# Patient Record
Sex: Female | Born: 1945 | Hispanic: No | Smoking: Never smoker
Health system: Southern US, Community
[De-identification: ages and names within clinical notes are randomized; demographics above are authoritative.]

## PROBLEM LIST (undated history)

## (undated) DIAGNOSIS — E119 Type 2 diabetes mellitus without complications: Secondary | ICD-10-CM

## (undated) DIAGNOSIS — I10 Essential (primary) hypertension: Secondary | ICD-10-CM

## (undated) DIAGNOSIS — C801 Malignant (primary) neoplasm, unspecified: Secondary | ICD-10-CM

## (undated) HISTORY — PX: COLON RESECTION: SHX5231

---

## 2015-06-19 ENCOUNTER — Emergency Department (HOSPITAL_COMMUNITY): Payer: Self-pay

## 2015-06-19 ENCOUNTER — Emergency Department (HOSPITAL_COMMUNITY)
Admission: EM | Admit: 2015-06-19 | Discharge: 2015-06-19 | Disposition: A | Discharge status: Home / Self Care | Payer: Self-pay | Attending: Emergency Medicine | Admitting: Emergency Medicine

## 2015-06-19 ENCOUNTER — Encounter (HOSPITAL_COMMUNITY): Payer: Self-pay | Admitting: *Deleted

## 2015-06-19 DIAGNOSIS — M5431 Sciatica, right side: Secondary | ICD-10-CM

## 2015-06-19 DIAGNOSIS — R748 Abnormal levels of other serum enzymes: Secondary | ICD-10-CM

## 2015-06-19 DIAGNOSIS — R109 Unspecified abdominal pain: Secondary | ICD-10-CM | POA: Insufficient documentation

## 2015-06-19 DIAGNOSIS — Z85038 Personal history of other malignant neoplasm of large intestine: Secondary | ICD-10-CM | POA: Insufficient documentation

## 2015-06-19 DIAGNOSIS — M25551 Pain in right hip: Secondary | ICD-10-CM | POA: Insufficient documentation

## 2015-06-19 DIAGNOSIS — I10 Essential (primary) hypertension: Secondary | ICD-10-CM | POA: Insufficient documentation

## 2015-06-19 DIAGNOSIS — E119 Type 2 diabetes mellitus without complications: Secondary | ICD-10-CM | POA: Insufficient documentation

## 2015-06-19 HISTORY — DX: Type 2 diabetes mellitus without complications: E11.9

## 2015-06-19 HISTORY — DX: Essential (primary) hypertension: I10

## 2015-06-19 HISTORY — DX: Malignant (primary) neoplasm, unspecified: C80.1

## 2015-06-19 LAB — COMPREHENSIVE METABOLIC PANEL
ALK PHOS: 53 U/L (ref 38–126)
ALT: 15 U/L (ref 14–54)
AST: 18 U/L (ref 15–41)
Albumin: 3.6 g/dL (ref 3.5–5.0)
Anion gap: 9 (ref 5–15)
BILIRUBIN TOTAL: 1 mg/dL (ref 0.3–1.2)
BUN: 16 mg/dL (ref 6–20)
CALCIUM: 8.7 mg/dL — AB (ref 8.9–10.3)
CO2: 23 mmol/L (ref 22–32)
CREATININE: 0.94 mg/dL (ref 0.44–1.00)
Chloride: 106 mmol/L (ref 101–111)
GFR, EST NON AFRICAN AMERICAN: 59 mL/min — AB (ref >60)
Glucose, Bld: 194 mg/dL — ABNORMAL HIGH (ref 65–99)
Potassium: 4.6 mmol/L (ref 3.5–5.1)
Sodium: 138 mmol/L (ref 135–145)
Total Protein: 7.4 g/dL (ref 6.5–8.1)

## 2015-06-19 LAB — LIPASE, BLOOD: Lipase: 72 U/L — ABNORMAL HIGH (ref 11–51)

## 2015-06-19 LAB — URINALYSIS, ROUTINE W REFLEX MICROSCOPIC
BILIRUBIN URINE: NEGATIVE
Glucose, UA: NEGATIVE mg/dL
HGB URINE DIPSTICK: NEGATIVE
Ketones, ur: NEGATIVE mg/dL
Nitrite: NEGATIVE
PROTEIN: NEGATIVE mg/dL
Specific Gravity, Urine: 1.021 (ref 1.005–1.030)
pH: 5 (ref 5.0–8.0)

## 2015-06-19 LAB — CBC
HCT: 35.1 % — ABNORMAL LOW (ref 36.0–46.0)
Hemoglobin: 11.1 g/dL — ABNORMAL LOW (ref 12.0–15.0)
MCH: 28.2 pg (ref 26.0–34.0)
MCHC: 31.6 g/dL (ref 30.0–36.0)
MCV: 89.3 fL (ref 78.0–100.0)
PLATELETS: 230 10*3/uL (ref 150–400)
RBC: 3.93 MIL/uL (ref 3.87–5.11)
RDW: 14.4 % (ref 11.5–15.5)
WBC: 7.2 10*3/uL (ref 4.0–10.5)

## 2015-06-19 LAB — URINE MICROSCOPIC-ADD ON

## 2015-06-19 MED ORDER — TRAMADOL HCL 50 MG PO TABS: 50.0000 mg | ORAL_TABLET | Freq: Four times a day (QID) | ORAL | Status: AC | PRN

## 2015-06-19 MED ORDER — IOPAMIDOL (ISOVUE-300) INJECTION 61%
INTRAVENOUS | Status: AC
  Administered 2015-06-19: 100 mL
  Filled 2015-06-19: qty 100

## 2015-06-19 NOTE — ED Notes (Signed)
Pt c/o R hip and R lower back pain x 5 days that increases when she walks.  States LLQ pain also started about the same time.  Hx of colon ca 2014 with hernia repair 2016.  Denies changes in bowel habits.

## 2015-06-19 NOTE — ED Provider Notes (Signed)
CSN: [id number]     Arrival date & time 06/19/15  [id number] History   First MD Initiated Contact with Patient 06/19/15 1122     Chief Complaint  Patient presents with  . Back Pain  . Abdominal Pain     (Consider location/radiation/quality/duration/timing/severity/associated sxs/prior Treatment) Patient is a 70 y.o. female presenting with back pain and abdominal pain. The history is provided by the patient (Patient complains of mild abdominal discomfort and pain in the right hip).  Back Pain Location:  Lumbar spine Quality:  Aching Radiates to: Pain in right hip. Pain severity:  Moderate Pain is:  Same all the time Onset quality:  Sudden Timing:  Constant Progression:  Waxing and waning Chronicity:  New Context: not emotional stress   Associated symptoms: abdominal pain   Associated symptoms: no chest pain and no headaches   Abdominal Pain Associated symptoms: no chest pain, no cough, no diarrhea, no fatigue and no hematuria     Past Medical History  Diagnosis Date  . Cancer ([place])     colon  . Diabetes mellitus without complication ([place])   . Hypertension    Past Surgical History  Procedure Laterality Date  . Colon resection     No family history on file. Social History  Substance Use Topics  . Smoking status: Never Smoker   . Smokeless tobacco: None  . Alcohol Use: No   OB History    No data available     Review of Systems  Constitutional: Negative for appetite change and fatigue.  HENT: Negative for congestion, ear discharge and sinus pressure.   Eyes: Negative for discharge.  Respiratory: Negative for cough.   Cardiovascular: Negative for chest pain.  Gastrointestinal: Positive for abdominal pain. Negative for diarrhea.  Genitourinary: Negative for frequency and hematuria.  Musculoskeletal: Positive for back pain.       Right hip pain  Skin: Negative for rash.  Neurological: Negative for seizures and headaches.  Psychiatric/Behavioral: Negative for  hallucinations.      Allergies  Review of patient's allergies indicates no known allergies.  Home Medications   Prior to Admission medications   Medication Sig Start Date End Date Taking? Authorizing Provider  traMADol (ULTRAM) 50 MG tablet Take 1 tablet (50 mg total) by mouth every 6 (six) hours as needed. 06/19/15   Milton Ferguson, MD   BP 139/79 mmHg  Pulse 66  Temp(Src) 98.6 F (37 C) (Oral)  Resp 16  Ht 6\' 5"  (1.956 m)  Wt 154 lb 5.2 oz (70 kg)  BMI 18.30 kg/m2  SpO2 98% Physical Exam  Constitutional: She is oriented to person, place, and time. She appears well-developed.  HENT:  Head: Normocephalic.  Eyes: Conjunctivae and EOM are normal. No scleral icterus.  Neck: Neck supple. No thyromegaly present.  Cardiovascular: Normal rate and regular rhythm.  Exam reveals no gallop and no friction rub.   No murmur heard. Pulmonary/Chest: No stridor. She has no wheezes. She has no rales. She exhibits no tenderness.  Abdominal: She exhibits no distension. There is tenderness. There is no rebound.  Minimal left lower quadrant tenderness  Musculoskeletal: She exhibits no edema.  Mild tenderness to right hip  Lymphadenopathy:    She has no cervical adenopathy.  Neurological: She is oriented to person, place, and time. She exhibits normal muscle tone. Coordination normal.  Skin: No rash noted. No erythema.  Psychiatric: She has a normal mood and affect. Her behavior is normal.    ED Course  Procedures (including critical  care time) Labs Review Labs Reviewed  LIPASE, BLOOD - Abnormal; Notable for the following:    Lipase 72 (*)    All other components within normal limits  COMPREHENSIVE METABOLIC PANEL - Abnormal; Notable for the following:    Glucose, Bld 194 (*)    Calcium 8.7 (*)    GFR calc non Af Amer 59 (*)    All other components within normal limits  CBC - Abnormal; Notable for the following:    Hemoglobin 11.1 (*)    HCT 35.1 (*)    All other components within  normal limits  URINALYSIS, ROUTINE W REFLEX MICROSCOPIC (NOT AT [facility]) - Abnormal; Notable for the following:    Leukocytes, UA TRACE (*)    All other components within normal limits  URINE MICROSCOPIC-ADD ON - Abnormal; Notable for the following:    Squamous Epithelial / LPF 0-5 (*)    Bacteria, UA RARE (*)    All other components within normal limits    Imaging Review Ct Abdomen Pelvis W Contrast  06/19/2015  CLINICAL DATA:  70 year old female with history of colon cancer status post partial colectomy. Right lower quadrant abdominal pain for the past 3 months. EXAM: CT ABDOMEN AND PELVIS WITH CONTRAST TECHNIQUE: Multidetector CT imaging of the abdomen and pelvis was performed using the standard protocol following bolus administration of intravenous contrast. CONTRAST:  100 mL ISOVUE-300 IOPAMIDOL (ISOVUE-300) INJECTION 61% COMPARISON:  No priors. FINDINGS: Lower chest: Mild cardiomegaly. Atherosclerotic calcifications in the left anterior descending, left circumflex and right coronary arteries. Hepatobiliary: No cystic or solid hepatic lesions. No intra or extrahepatic biliary ductal dilatation. Tiny calcified granuloma in the right lobe of the liver incidentally noted. Gallbladder is normal in appearance. Pancreas: No pancreatic mass. No pancreatic ductal dilatation. No pancreatic or peripancreatic fluid or inflammatory changes. Spleen: Unremarkable. Adrenals/Urinary Tract: 4.0 cm low-attenuation lesion in the upper pole of the right kidney is compatible with a simple cyst. 3 mm nonobstructive calculus in the upper pole collecting system of the left kidney. No additional calculi are noted within the right renal collecting system or along the course of either ureter. No hydroureteronephrosis. Urinary bladder is normal in appearance. Bilateral adrenal glands are normal in appearance. Stomach/Bowel: The appearance of the stomach is normal. There is no pathologic dilatation of small bowel or colon. Status  post right hemicolectomy. Vascular/Lymphatic: Atherosclerotic calcifications throughout the abdominal and pelvic vasculature, without evidence of aneurysm or dissection. No lymphadenopathy noted in the abdomen or pelvis. Reproductive: Uterus and ovaries are unremarkable in appearance. Other: Surgical changes of mesh repair for ventral hernia are noted. No evidence of recurrent herniation. No significant volume of ascites. No pneumoperitoneum. Musculoskeletal: There are no aggressive appearing lytic or blastic lesions noted in the visualized portions of the skeleton. IMPRESSION: 1. No explanation for the patient's history of right lower quadrant abdominal pain. 2. Postoperative changes of right hemicolectomy and ventral hernia repair. 3. Atherosclerosis, including at least 3 vessel coronary artery disease. Assessment for potential risk factor modification, dietary therapy or pharmacologic therapy may be warranted, if clinically indicated. 4. Mild cardiomegaly. 5. Additional incidental findings, as above. Electronically Signed   By: Vinnie Langton M.D.   On: 06/19/2015 13:46   Dg Hip Unilat With Pelvis 2-3 Views Right  06/19/2015  CLINICAL DATA:  Posterior right hip pain 5 days worse when walking. No injury. EXAM: DG HIP (WITH OR WITHOUT PELVIS) 2-3V RIGHT COMPARISON:  None. FINDINGS: Exam demonstrates mild symmetric degenerative changes of the hips. There is  degenerative change of the symphysis pubis joint with mild degenerate change of the spine. No evidence of acute fracture or dislocation. IMPRESSION: No acute findings. Electronically Signed   By: Marin Olp M.D.   On: 06/19/2015 13:20   I have personally reviewed and evaluated these images and lab results as part of my medical decision-making.   EKG Interpretation None      MDM   Final diagnoses:  Sciatica of right side  Elevated lipase    Patient with elevated lipase but CT scan of abdomen unremarkable. Patient with arthritic changes and  back. Patient has sciatica and elevated lipase patient will be discharged with Ultram and will follow-up with family doctor    Milton Ferguson, MD 06/19/15 1510

## 2015-06-19 NOTE — Discharge Instructions (Signed)
Follow-up with [facility] in 1-2 weeks

## 2015-06-19 NOTE — ED Notes (Signed)
Pt. Just ambulated to the bathroom, gait steady. Pt. Placed back into bed. Placed on BP and Pulse OX.  Son given coffee

## 2015-06-19 NOTE — ED Notes (Signed)
Pt. Placed in Pod E 6.

## 2017-06-03 IMAGING — DX DG HIP (WITH OR WITHOUT PELVIS) 2-3V*R*
3 series · 3 of 3 positions shown · non-contrast
Comparison: None.

CLINICAL DATA: Posterior right hip pain 5 days worse when walking.
No injury.

EXAM:
DG HIP (WITH OR WITHOUT PELVIS) 2-3V RIGHT

[t pelvis ap]
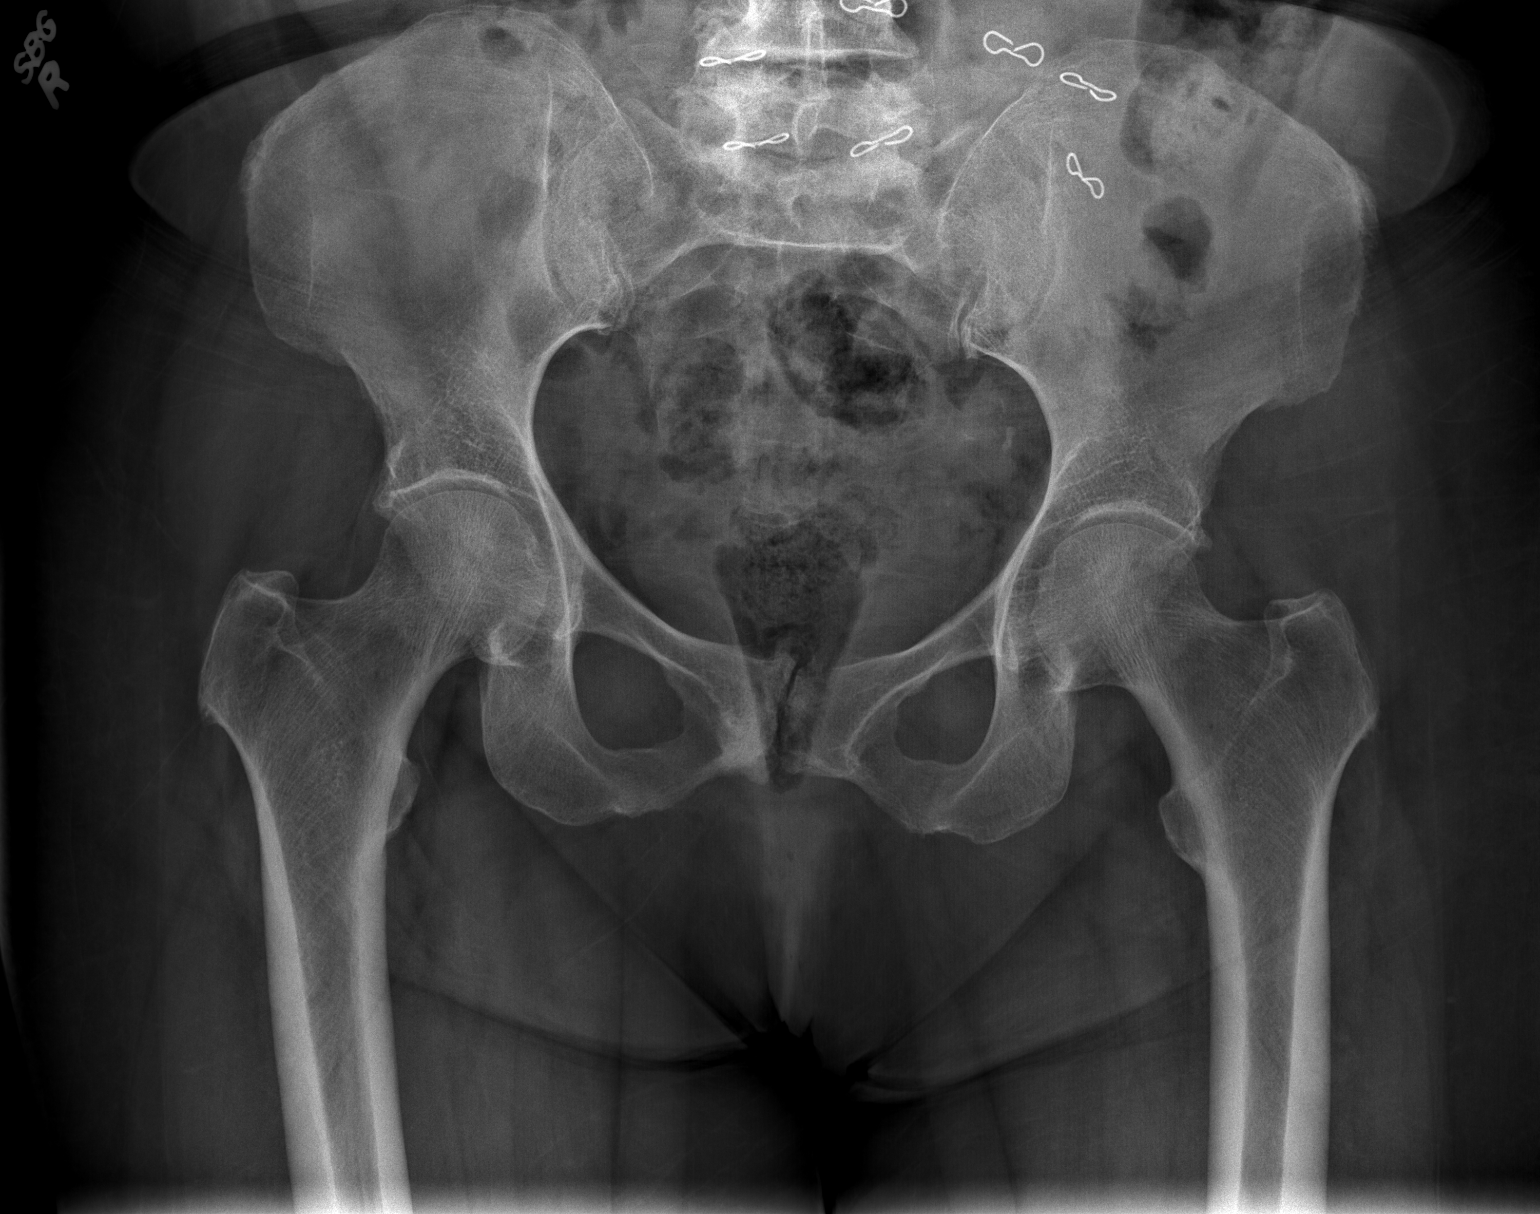

[t hip ap right]
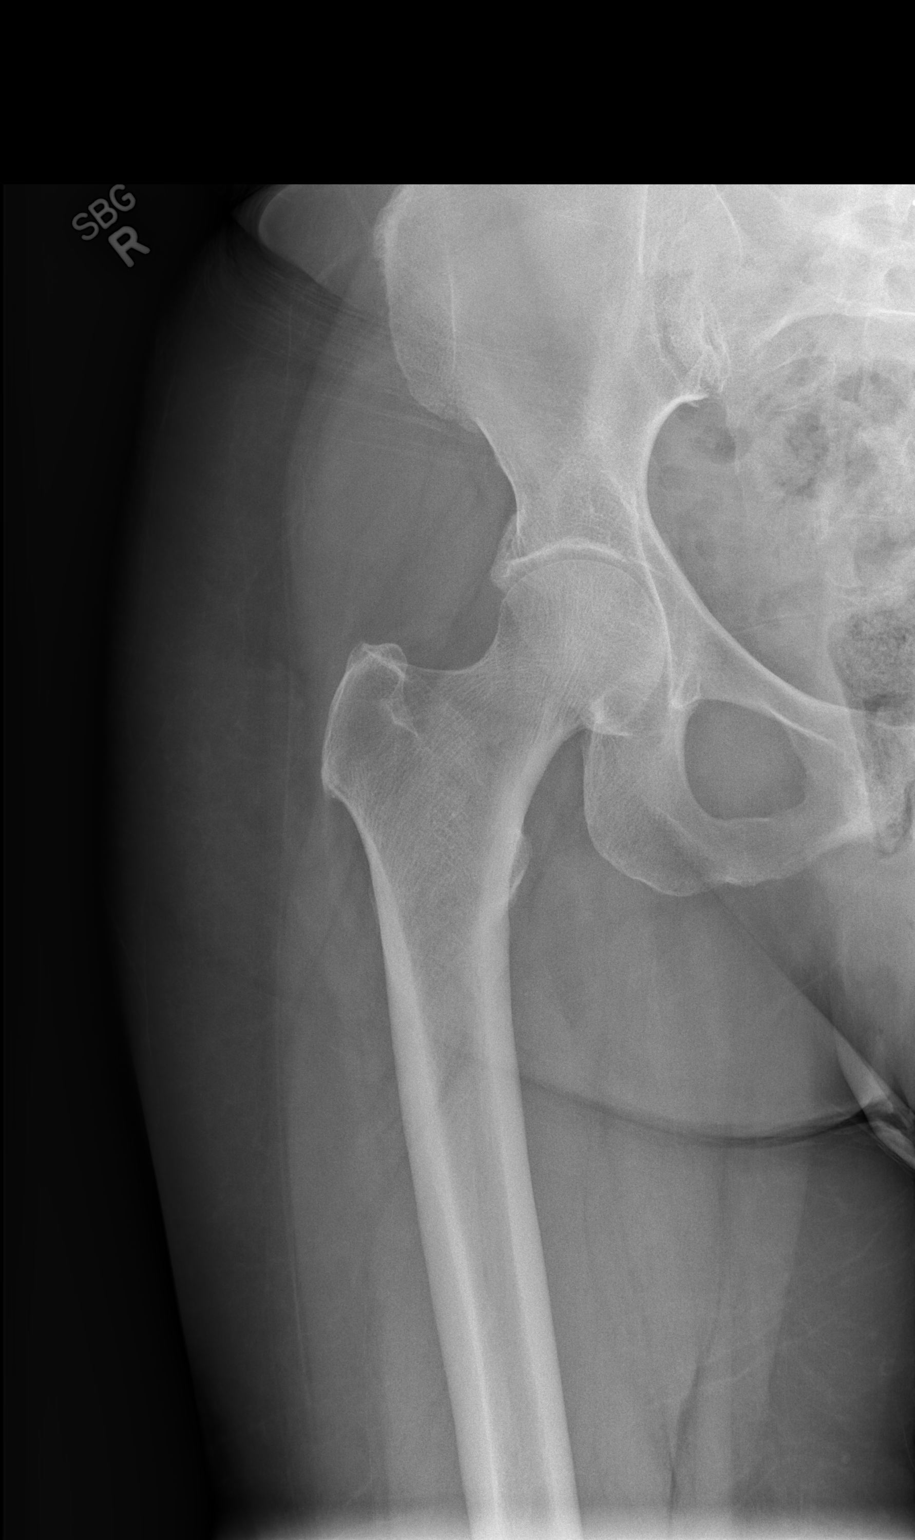

[t hip frog leg right]
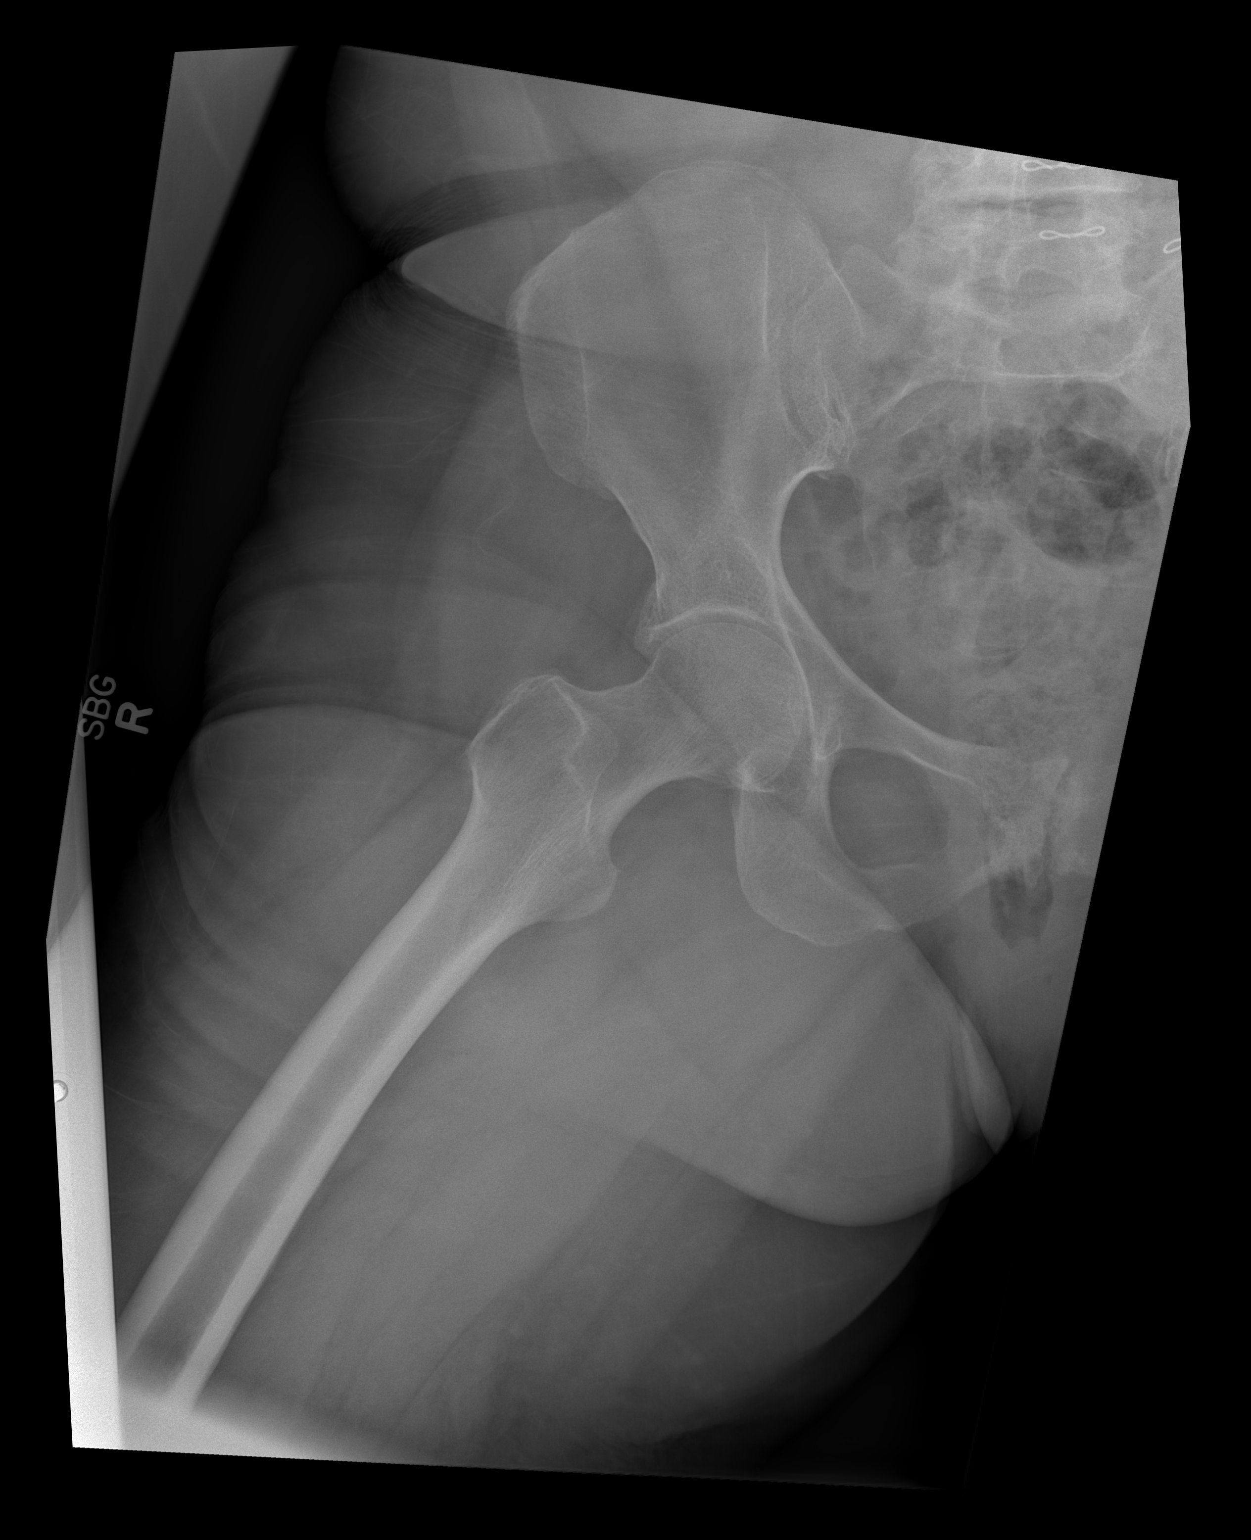

[3 of 3 positions shown; findings below may reference images not displayed]

FINDINGS: Exam demonstrates mild symmetric degenerative changes of the hips.
There is degenerative change of the symphysis pubis joint with mild
degenerate change of the spine. No evidence of acute fracture or
dislocation.
IMPRESSION: No acute findings.

## 2017-06-03 IMAGING — CT CT ABD-PELV W/ CM
2 of 4 series · 16 of 46 positions shown, 18 images · IV contrast (Omni 300)
Comparison: No priors.

CLINICAL DATA: 70-year-old female with history of colon cancer
status post partial colectomy. Right lower quadrant abdominal pain
for the past 3 months.

EXAM:
CT ABDOMEN AND PELVIS WITH CONTRAST
TECHNIQUE: Multidetector CT imaging of the abdomen and pelvis was performed
using the standard protocol following bolus administration of
intravenous contrast.
CONTRAST:  100 mL 3WUXXJ-9AA IOPAMIDOL (3WUXXJ-9AA) INJECTION 61%

[Series 2: a/p w/ 5mm · axial · 0.90mm/px · z∈[+752,+1117]mm · 13 of 83 slices shown, 15 images]
[im 5/83  soft-tissue]
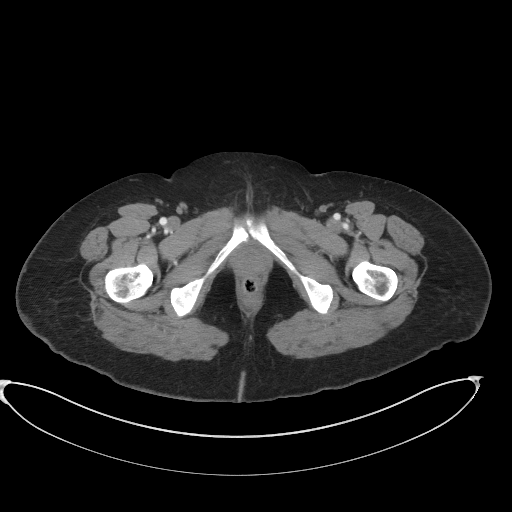
[im 5/83  bone]
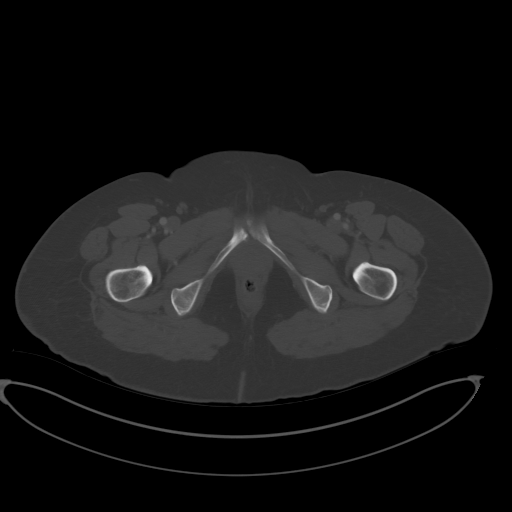
[im 13/83  soft-tissue]
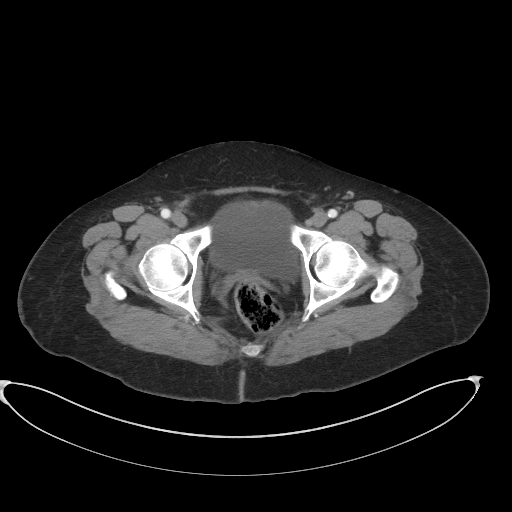
[im 18/83  soft-tissue]
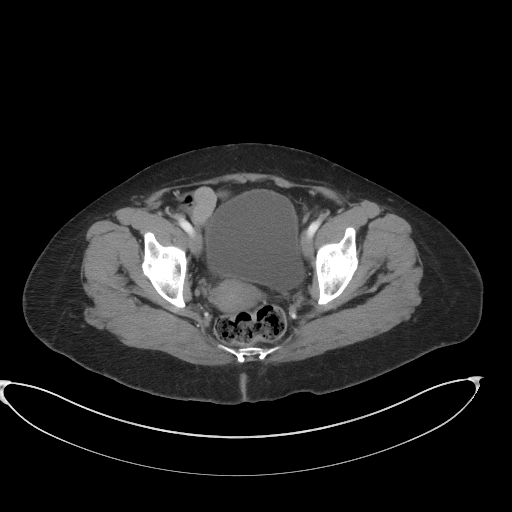
[im 22/83  soft-tissue]
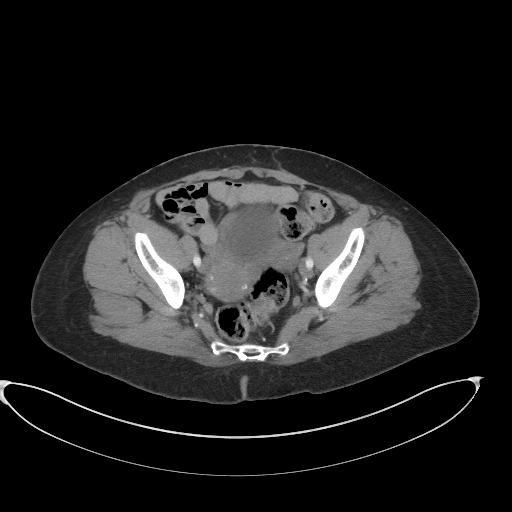
[im 31/83  soft-tissue]
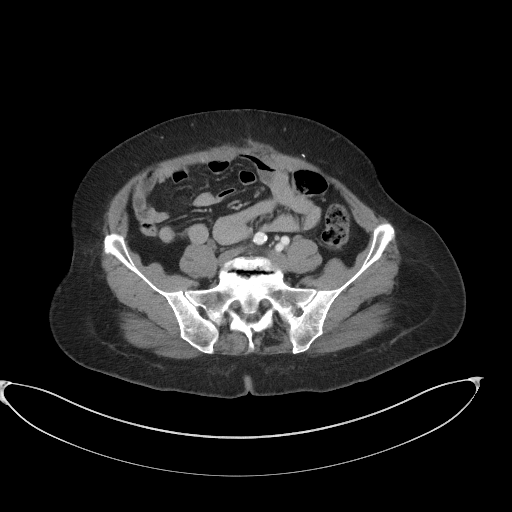
[im 35/83  soft-tissue]
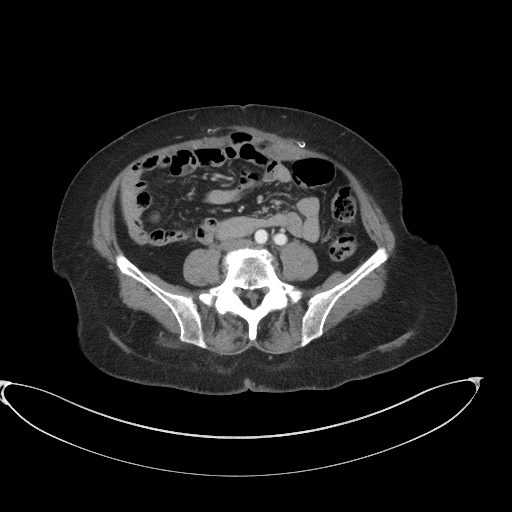
[im 44/83  soft-tissue]
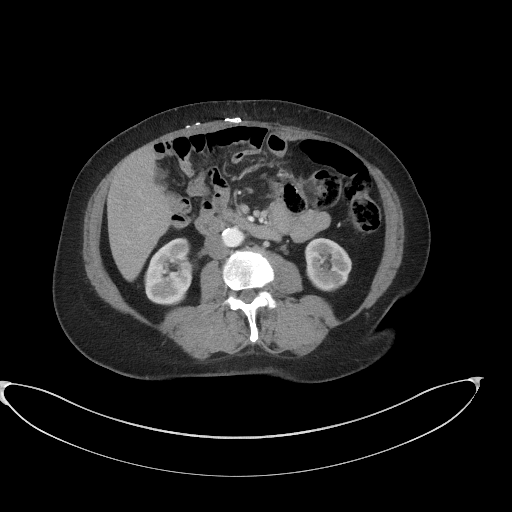
[im 48/83  soft-tissue]
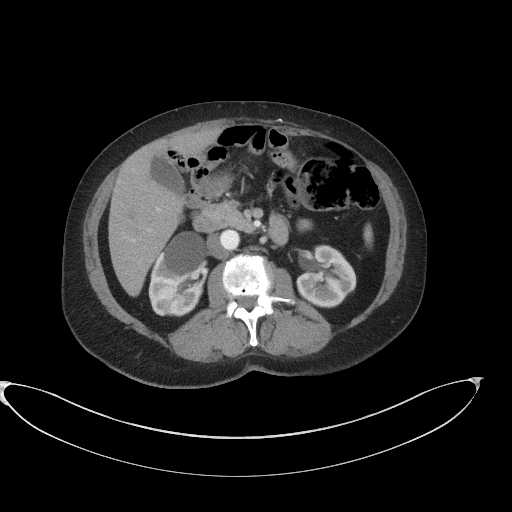
[im 52/83  soft-tissue]
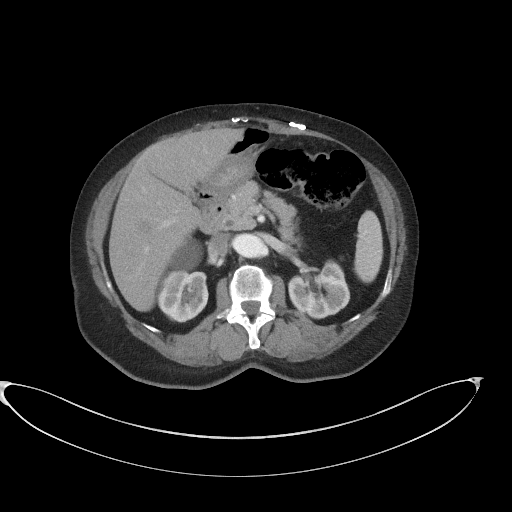
[im 52/83  bone]
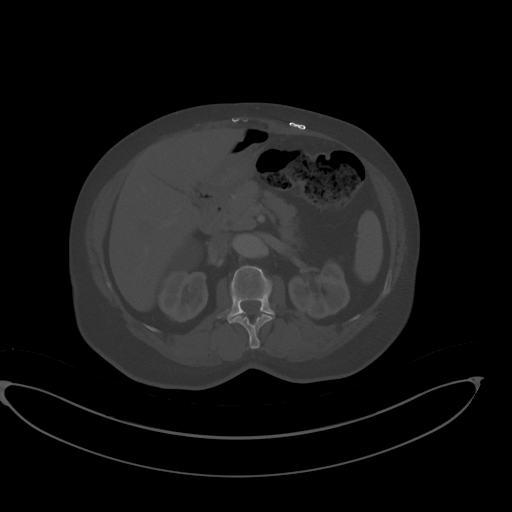
[im 61/83  soft-tissue]
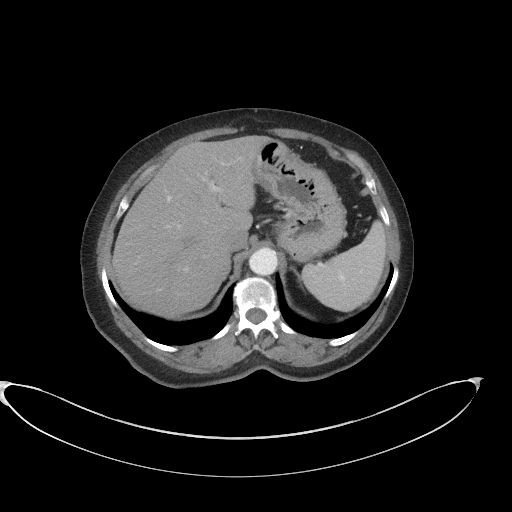
[im 65/83  soft-tissue]
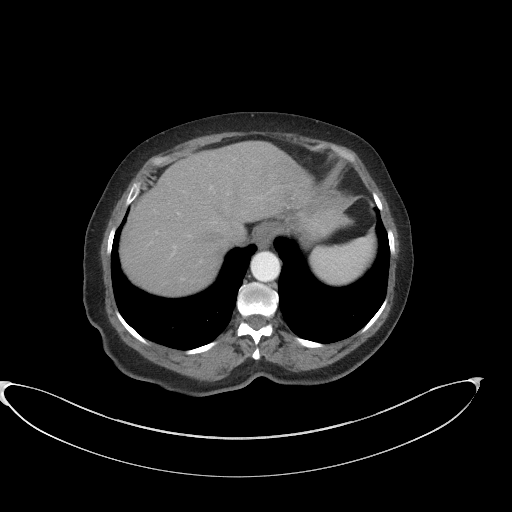
[im 70/83  soft-tissue]
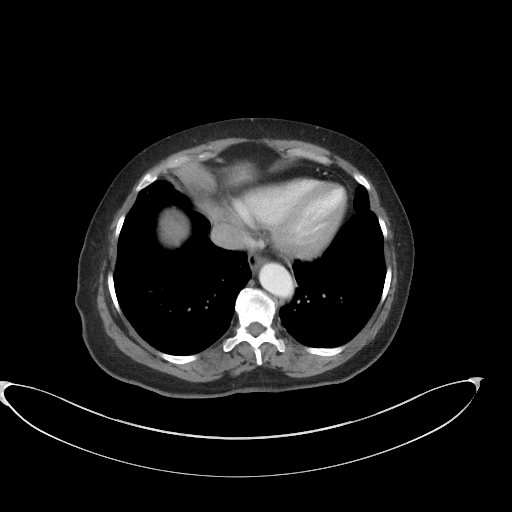
[im 78/83  soft-tissue]
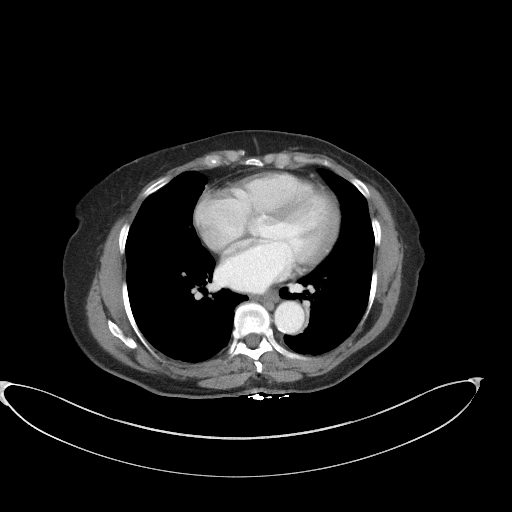

[Series 5: a/p w/ cor · coronal · 0.80mm/px · 3 of 147 slices shown]
[im 49/147  soft-tissue]
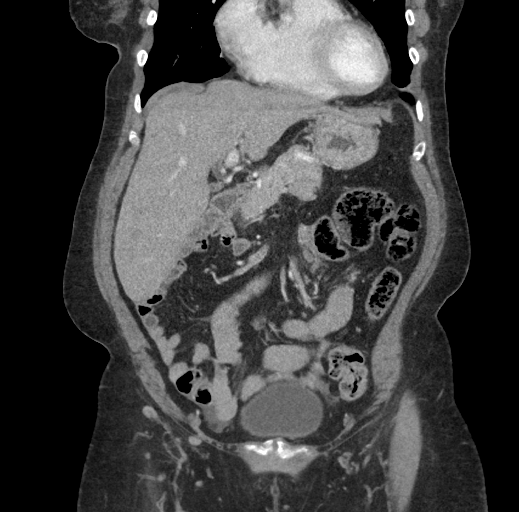
[im 65/147  soft-tissue]
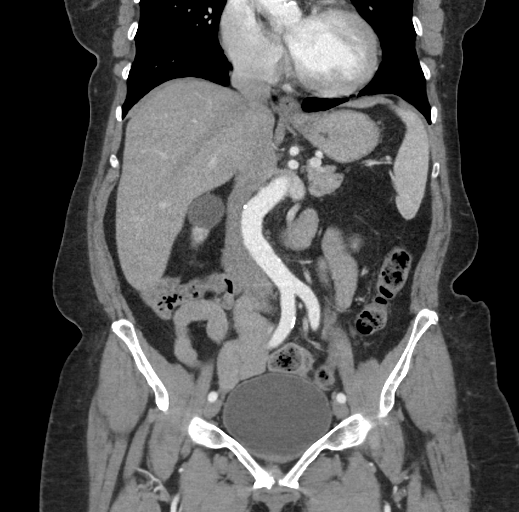
[im 82/147  soft-tissue]
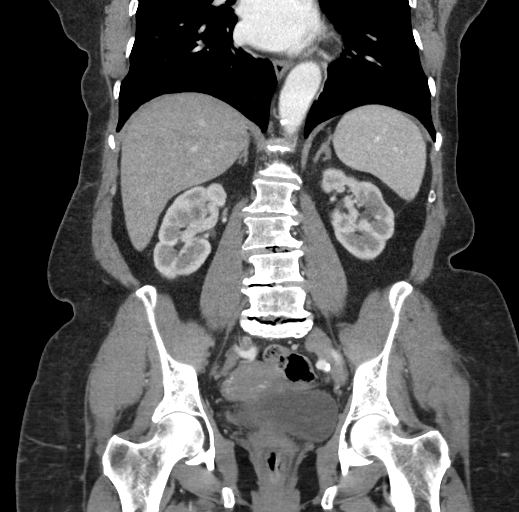

[16 of 46 positions shown; findings below may reference images not displayed]

FINDINGS: Lower chest: Mild cardiomegaly. Atherosclerotic calcifications in
the left anterior descending, left circumflex and right coronary
arteries.

Hepatobiliary: No cystic or solid hepatic lesions. No intra or
extrahepatic biliary ductal dilatation. Tiny calcified granuloma in
the right lobe of the liver incidentally noted. Gallbladder is
normal in appearance.

Pancreas: No pancreatic mass. No pancreatic ductal dilatation. No
pancreatic or peripancreatic fluid or inflammatory changes.

Spleen: Unremarkable.

Adrenals/Urinary Tract: 4.0 cm low-attenuation lesion in the upper
pole of the right kidney is compatible with a simple cyst. 3 mm
nonobstructive calculus in the upper pole collecting system of the
left kidney. No additional calculi are noted within the right renal
collecting system or along the course of either ureter. No
hydroureteronephrosis. Urinary bladder is normal in appearance.
Bilateral adrenal glands are normal in appearance.

Stomach/Bowel: The appearance of the stomach is normal. There is no
pathologic dilatation of small bowel or colon. Status post right
hemicolectomy.

Vascular/Lymphatic: Atherosclerotic calcifications throughout the
abdominal and pelvic vasculature, without evidence of aneurysm or
dissection. No lymphadenopathy noted in the abdomen or pelvis.

Reproductive: Uterus and ovaries are unremarkable in appearance.

Other: Surgical changes of mesh repair for ventral hernia are noted.
No evidence of recurrent herniation. No significant volume of
ascites. No pneumoperitoneum.

Musculoskeletal: There are no aggressive appearing lytic or blastic
lesions noted in the visualized portions of the skeleton.
IMPRESSION: 1. No explanation for the patient's history of right lower quadrant
abdominal pain.
2. Postoperative changes of right hemicolectomy and ventral hernia
repair.
3. Atherosclerosis, including at least 3 vessel coronary artery
disease. Assessment for potential risk factor modification, dietary
therapy or pharmacologic therapy may be warranted, if clinically
indicated.
4. Mild cardiomegaly.
5. Additional incidental findings, as above.
